# Patient Record
Sex: Female | Born: 1960 | Race: Black or African American | Hispanic: No | Marital: Married | Smoking: Never smoker
Health system: Southern US, Community
[De-identification: ages and names within clinical notes are randomized; demographics above are authoritative.]

## PROBLEM LIST (undated history)

## (undated) HISTORY — PX: TONSILLECTOMY: SUR1361

## (undated) HISTORY — PX: TUBAL LIGATION: SHX77

---

## 1998-05-21 ENCOUNTER — Encounter: Admission: RE | Admit: 1998-05-21 | Discharge: 1998-08-19 | Payer: Self-pay | Admitting: Family Medicine

## 1999-03-20 ENCOUNTER — Other Ambulatory Visit: Admission: RE | Admit: 1999-03-20 | Discharge: 1999-03-20 | Payer: Self-pay | Admitting: Obstetrics and Gynecology

## 2002-10-21 ENCOUNTER — Ambulatory Visit (HOSPITAL_COMMUNITY): Admission: RE | Admit: 2002-10-21 | Discharge: 2002-10-21 | Payer: Self-pay | Admitting: Obstetrics

## 2002-10-21 ENCOUNTER — Encounter: Payer: Self-pay | Admitting: Obstetrics

## 2004-05-10 ENCOUNTER — Ambulatory Visit (HOSPITAL_COMMUNITY): Admission: RE | Admit: 2004-05-10 | Discharge: 2004-05-10 | Payer: Self-pay | Admitting: Obstetrics

## 2005-12-02 ENCOUNTER — Ambulatory Visit (HOSPITAL_COMMUNITY): Admission: RE | Admit: 2005-12-02 | Discharge: 2005-12-02 | Payer: Self-pay | Admitting: Obstetrics

## 2005-12-10 ENCOUNTER — Ambulatory Visit (HOSPITAL_COMMUNITY): Admission: RE | Admit: 2005-12-10 | Discharge: 2005-12-10 | Payer: Self-pay | Admitting: Obstetrics

## 2006-11-19 ENCOUNTER — Encounter: Admission: RE | Admit: 2006-11-19 | Discharge: 2006-11-19 | Payer: Self-pay | Admitting: Family Medicine

## 2010-02-23 ENCOUNTER — Encounter: Payer: Self-pay | Admitting: Obstetrics

## 2010-10-17 ENCOUNTER — Other Ambulatory Visit (HOSPITAL_COMMUNITY): Payer: Self-pay | Admitting: Obstetrics and Gynecology

## 2010-10-17 DIAGNOSIS — Z1231 Encounter for screening mammogram for malignant neoplasm of breast: Secondary | ICD-10-CM

## 2010-10-29 ENCOUNTER — Ambulatory Visit (HOSPITAL_COMMUNITY)
Admission: RE | Admit: 2010-10-29 | Discharge: 2010-10-29 | Disposition: A | Discharge status: Home / Self Care | Payer: BC Managed Care – PPO | Source: Ambulatory Visit | Attending: Obstetrics and Gynecology | Admitting: Obstetrics and Gynecology

## 2010-10-29 DIAGNOSIS — Z1231 Encounter for screening mammogram for malignant neoplasm of breast: Secondary | ICD-10-CM | POA: Insufficient documentation

## 2012-05-15 ENCOUNTER — Emergency Department (HOSPITAL_COMMUNITY)
Admission: EM | Admit: 2012-05-15 | Discharge: 2012-05-15 | Disposition: A | Discharge status: Home / Self Care | Payer: BC Managed Care – PPO | Attending: Emergency Medicine | Admitting: Emergency Medicine

## 2012-05-15 ENCOUNTER — Encounter (HOSPITAL_COMMUNITY): Payer: Self-pay | Admitting: Emergency Medicine

## 2012-05-15 ENCOUNTER — Emergency Department (HOSPITAL_COMMUNITY): Payer: BC Managed Care – PPO

## 2012-05-15 DIAGNOSIS — R079 Chest pain, unspecified: Secondary | ICD-10-CM

## 2012-05-15 DIAGNOSIS — R0602 Shortness of breath: Secondary | ICD-10-CM | POA: Insufficient documentation

## 2012-05-15 DIAGNOSIS — R0789 Other chest pain: Secondary | ICD-10-CM | POA: Insufficient documentation

## 2012-05-15 LAB — URINALYSIS, ROUTINE W REFLEX MICROSCOPIC
Bilirubin Urine: NEGATIVE
Ketones, ur: NEGATIVE mg/dL
Nitrite: NEGATIVE
Protein, ur: NEGATIVE mg/dL
Urobilinogen, UA: 0.2 mg/dL (ref 0.0–1.0)

## 2012-05-15 LAB — CBC WITH DIFFERENTIAL/PLATELET
Basophils Absolute: 0 10*3/uL (ref 0.0–0.1)
Basophils Relative: 0 % (ref 0–1)
Eosinophils Absolute: 0.1 10*3/uL (ref 0.0–0.7)
Hemoglobin: 13.3 g/dL (ref 12.0–15.0)
MCH: 28.5 pg (ref 26.0–34.0)
MCHC: 34.6 g/dL (ref 30.0–36.0)
Monocytes Absolute: 0.5 10*3/uL (ref 0.1–1.0)
Monocytes Relative: 6 % (ref 3–12)
Neutrophils Relative %: 62 % (ref 43–77)
RDW: 14.2 % (ref 11.5–15.5)

## 2012-05-15 LAB — POCT I-STAT, CHEM 8
Calcium, Ion: 0.98 mmol/L — ABNORMAL LOW (ref 1.12–1.23)
Glucose, Bld: 114 mg/dL — ABNORMAL HIGH (ref 70–99)
HCT: 41 % (ref 36.0–46.0)
Hemoglobin: 13.9 g/dL (ref 12.0–15.0)
TCO2: 21 mmol/L (ref 0–100)

## 2012-05-15 LAB — BASIC METABOLIC PANEL
BUN: 9 mg/dL (ref 6–23)
Creatinine, Ser: 0.76 mg/dL (ref 0.50–1.10)
GFR calc Af Amer: >90 mL/min (ref >90)
GFR calc non Af Amer: >90 mL/min (ref >90)

## 2012-05-15 NOTE — ED Provider Notes (Signed)
Patient's symptoms are atypical for heart disease. It has been constant for the last 2 days. Her cardiac markers are normal.  Low suspicion for heart disease  Chloe Kras, MD 05/15/12 [phone]

## 2012-05-15 NOTE — ED Provider Notes (Signed)
History     CSN: [id number]  Arrival date & time 05/15/12  1346   First MD Initiated Contact with Patient 05/15/12 1352      Chief Complaint  Patient presents with  . Chest Pain    (Consider location/radiation/quality/duration/timing/severity/associated sxs/prior treatment) HPI Comments: Patient is a 52 year old female who presents today after experiencing 2 days of gradually worsening chest pain. She describes it as a pressure with radiation into her right arm. It is worse with movement and palpation. The pain is centrally located on her sternum with radiation into her right ribs. She believed this was heartburn and took antacids which did not help. Her pain is improved when she belches and passes gas. She has sometimes been short of breath. Position change help such as sitting upright. Her mother had a heart attack at a young age. She is not sure how old she was. No history of heart disease. She was evaluated by her family physician and was sent here.  The history is provided by the patient. No language interpreter was used.    History reviewed. No pertinent past medical history.  Past Surgical History  Procedure Laterality Date  . Tonsillectomy    . Cesarean section      x3  . Tubal ligation      No family history on file.  History  Substance Use Topics  . Smoking status: Not on file  . Smokeless tobacco: Not on file  . Alcohol Use: Not on file    OB History   Grav Para Term Preterm Abortions TAB SAB Ect Mult Living                  Review of Systems  Constitutional: Negative for fever and chills.  Respiratory: Positive for chest tightness and shortness of breath.   Cardiovascular: Positive for chest pain. Negative for palpitations.  Neurological: Negative for syncope, weakness and numbness.  All other systems reviewed and are negative.    Allergies  Penicillins  Home Medications  No current outpatient prescriptions on file.  LMP 04/25/2012  Physical  Exam  Nursing note and vitals reviewed. Constitutional: She is oriented to person, place, and time. She appears well-developed and well-nourished. No distress.  HENT:  Head: Normocephalic and atraumatic.  Right Ear: External ear normal.  Left Ear: External ear normal.  Nose: Nose normal.  Mouth/Throat: Oropharynx is clear and moist.  Eyes: Conjunctivae are normal.  Neck: Normal range of motion.  Cardiovascular: Normal rate, regular rhythm and normal heart sounds.   Pulmonary/Chest: Effort normal and breath sounds normal. No stridor. No respiratory distress. She has no wheezes. She has no rales. She exhibits tenderness and bony tenderness. She exhibits no deformity and no swelling.  Abdominal: Soft. She exhibits no distension.  Musculoskeletal: Normal range of motion.  Neurological: She is alert and oriented to person, place, and time. She has normal strength.  Skin: Skin is warm and dry. She is not diaphoretic. No erythema.  Psychiatric: She has a normal mood and affect. Her behavior is normal.    ED Course  Procedures (including critical care time)  Labs Reviewed  BASIC METABOLIC PANEL - Abnormal; Notable for the following:    Glucose, Bld 107 (*)    All other components within normal limits  POCT I-STAT, CHEM 8 - Abnormal; Notable for the following:    Glucose, Bld 114 (*)    Calcium, Ion 0.98 (*)    All other components within normal limits  CBC WITH DIFFERENTIAL  URINALYSIS, ROUTINE W REFLEX MICROSCOPIC  POCT I-STAT TROPONIN I   Dg Chest 2 View  05/15/2012  *RADIOLOGY REPORT*  Clinical Data: Right-sided chest pain.  CHEST - 2 VIEW  Comparison: None.  Findings: Two views of the chest demonstrate clear lungs. Heart and mediastinum are within normal limits.  The trachea is midline. Bony thorax is intact.  IMPRESSION: No active cardiopulmonary disease.   Original Report Authenticated By: Richarda Overlie, M.D.      Date: 05/15/2012  Rate: 97  Rhythm: normal sinus rhythm  QRS Axis:  normal  Intervals: normal  ST/T Wave abnormalities: nonspecific T wave changes  Conduction Disutrbances:none  Narrative Interpretation:   Old EKG Reviewed: none available   1. Chest pain       MDM  Patient presents with 2 days of constant chest pain. Troponin 0. Labs grossly within normal limits. No concern for ACS. Should have outpatient work up with significant family history of cardiac disease. Discussed this is likely heartburn. You can take antacids. Follow up with PCP by next week. Strict return precautions given. Vital signs stable for discharge. Dr. Lynelle Doctor saw this patient and agrees with plan. Patient / Family / Caregiver informed of clinical course, understand medical decision-making process, and agree with plan.         Mora Bellman, PA-C 05/15/12 [phone]

## 2012-05-15 NOTE — ED Notes (Signed)
Pt c/o CP x 2 days. Mother of pt had massive MI at age 51. Pt thought her cp was gas and tx with antacids without relief. Relief when she expells gas, movement exacerbates pain. Went to md office this am and was sent here.

## 2013-03-18 ENCOUNTER — Other Ambulatory Visit: Payer: Self-pay | Admitting: Internal Medicine

## 2013-03-18 DIAGNOSIS — Z1231 Encounter for screening mammogram for malignant neoplasm of breast: Secondary | ICD-10-CM

## 2013-04-08 ENCOUNTER — Ambulatory Visit (HOSPITAL_COMMUNITY)
Admission: RE | Admit: 2013-04-08 | Discharge: 2013-04-08 | Disposition: A | Discharge status: Home / Self Care | Payer: BC Managed Care – PPO | Source: Ambulatory Visit | Attending: Internal Medicine | Admitting: Internal Medicine

## 2013-04-08 DIAGNOSIS — Z1231 Encounter for screening mammogram for malignant neoplasm of breast: Secondary | ICD-10-CM | POA: Insufficient documentation

## 2013-04-18 ENCOUNTER — Other Ambulatory Visit: Payer: Self-pay | Admitting: Internal Medicine

## 2013-04-18 DIAGNOSIS — R928 Other abnormal and inconclusive findings on diagnostic imaging of breast: Secondary | ICD-10-CM

## 2013-05-25 ENCOUNTER — Ambulatory Visit
Admission: RE | Admit: 2013-05-25 | Discharge: 2013-05-25 | Disposition: A | Discharge status: Home / Self Care | Payer: BC Managed Care – PPO | Source: Ambulatory Visit | Attending: Internal Medicine | Admitting: Internal Medicine

## 2013-05-25 DIAGNOSIS — R928 Other abnormal and inconclusive findings on diagnostic imaging of breast: Secondary | ICD-10-CM

## 2014-01-20 ENCOUNTER — Other Ambulatory Visit: Payer: Self-pay | Admitting: Internal Medicine

## 2014-01-20 DIAGNOSIS — R921 Mammographic calcification found on diagnostic imaging of breast: Secondary | ICD-10-CM

## 2014-02-06 ENCOUNTER — Ambulatory Visit
Admission: RE | Admit: 2014-02-06 | Discharge: 2014-02-06 | Disposition: A | Discharge status: Home / Self Care | Payer: BC Managed Care – PPO | Source: Ambulatory Visit | Attending: Internal Medicine | Admitting: Internal Medicine

## 2014-02-06 DIAGNOSIS — R921 Mammographic calcification found on diagnostic imaging of breast: Secondary | ICD-10-CM

## 2014-04-17 ENCOUNTER — Encounter (HOSPITAL_COMMUNITY): Payer: Self-pay | Admitting: Emergency Medicine

## 2014-04-17 ENCOUNTER — Emergency Department (HOSPITAL_COMMUNITY)
Admission: EM | Admit: 2014-04-17 | Discharge: 2014-04-18 | Disposition: A | Discharge status: Home / Self Care | Payer: BC Managed Care – PPO | Attending: Emergency Medicine | Admitting: Emergency Medicine

## 2014-04-17 DIAGNOSIS — Z7982 Long term (current) use of aspirin: Secondary | ICD-10-CM | POA: Insufficient documentation

## 2014-04-17 DIAGNOSIS — Z79899 Other long term (current) drug therapy: Secondary | ICD-10-CM | POA: Insufficient documentation

## 2014-04-17 DIAGNOSIS — R200 Anesthesia of skin: Secondary | ICD-10-CM

## 2014-04-17 DIAGNOSIS — E119 Type 2 diabetes mellitus without complications: Secondary | ICD-10-CM | POA: Diagnosis not present

## 2014-04-17 DIAGNOSIS — Z8669 Personal history of other diseases of the nervous system and sense organs: Secondary | ICD-10-CM | POA: Diagnosis not present

## 2014-04-17 DIAGNOSIS — M79609 Pain in unspecified limb: Secondary | ICD-10-CM

## 2014-04-17 DIAGNOSIS — Z880 Allergy status to penicillin: Secondary | ICD-10-CM | POA: Diagnosis not present

## 2014-04-17 DIAGNOSIS — R202 Paresthesia of skin: Secondary | ICD-10-CM | POA: Insufficient documentation

## 2014-04-17 DIAGNOSIS — E669 Obesity, unspecified: Secondary | ICD-10-CM | POA: Insufficient documentation

## 2014-04-17 NOTE — ED Notes (Signed)
Pt c/o tingling, numbness, and heaviness in her L hand since yesterday evening. Pt sts she had an episode of Bell's Palsy 15 years ago - pt has L side facial dropping as a result. Pt and family st that the drooping in her face is per normal. Pt Able to move L hand and can feel RN touching her. Pt can pick objects up with L hand.  A&Ox4 and ambulatory.

## 2014-04-18 ENCOUNTER — Emergency Department (HOSPITAL_COMMUNITY): Payer: BC Managed Care – PPO

## 2014-04-18 LAB — CBC WITH DIFFERENTIAL/PLATELET
BASOS ABS: 0 10*3/uL (ref 0.0–0.1)
Basophils Relative: 0 % (ref 0–1)
EOS PCT: 2 % (ref 0–5)
Eosinophils Absolute: 0.1 10*3/uL (ref 0.0–0.7)
HCT: 38.9 % (ref 36.0–46.0)
Hemoglobin: 12.8 g/dL (ref 12.0–15.0)
LYMPHS ABS: 3.1 10*3/uL (ref 0.7–4.0)
LYMPHS PCT: 42 % (ref 12–46)
MCH: 27.9 pg (ref 26.0–34.0)
MCHC: 32.9 g/dL (ref 30.0–36.0)
MCV: 84.7 fL (ref 78.0–100.0)
MONO ABS: 0.5 10*3/uL (ref 0.1–1.0)
MONOS PCT: 7 % (ref 3–12)
NEUTROS ABS: 3.6 10*3/uL (ref 1.7–7.7)
NEUTROS PCT: 49 % (ref 43–77)
PLATELETS: 280 10*3/uL (ref 150–400)
RBC: 4.59 MIL/uL (ref 3.87–5.11)
RDW: 14.8 % (ref 11.5–15.5)
WBC: 7.4 10*3/uL (ref 4.0–10.5)

## 2014-04-18 LAB — I-STAT CHEM 8, ED
BUN: 9 mg/dL (ref 6–23)
CHLORIDE: 102 mmol/L (ref 96–112)
CREATININE: 0.7 mg/dL (ref 0.50–1.10)
Calcium, Ion: 1.06 mmol/L — ABNORMAL LOW (ref 1.12–1.23)
GLUCOSE: 134 mg/dL — AB (ref 70–99)
HEMATOCRIT: 43 % (ref 36.0–46.0)
Hemoglobin: 14.6 g/dL (ref 12.0–15.0)
POTASSIUM: 4.1 mmol/L (ref 3.5–5.1)
Sodium: 137 mmol/L (ref 135–145)
TCO2: 21 mmol/L (ref 0–100)

## 2014-04-18 NOTE — Discharge Instructions (Signed)
Today, no specific cause for your paresthesia has been identified.  Your head CT is normal, your lab work is normal.  Your blood sugar is not out of control.  Please make an appointment with your primary care physician for further evaluation.  Please keep a written diary of your symptoms.  What makes it better or worse.  How long it lasts its progression, etc.

## 2014-04-18 NOTE — ED Provider Notes (Signed)
CSN: [id number]     Arrival date & time 04/17/14  1923 History   First MD Initiated Contact with Patient 04/18/14 0134     Chief Complaint  Patient presents with  . Numbness  . Tingling     (Consider location/radiation/quality/duration/timing/severity/associated sxs/prior Treatment) HPI Comments: Patient reports that she's had heaviness in tingling, i.e., pins and needles in her left hand since yesterday.  Since arriving in the emergency department today night.  The sensation has gone up to mid upper arm.  She does not have any weakness or decreased ability to grasp objects.  She has not lost sensation.  She reports that her blood sugar this morning was 167, which is slightly high for her.  She normally is in the 120 range.  She's been a non-insulin-dependent diabetic for the past 6 months.  Denies any recent illnesses or trauma .  Denies any neck pain.  She states she had a Bell's palsy which left her with a left facial droop proximally 15 years ago.  She states her ophthalmologist told her it was a stroke because she had speech difficulties at that time but did not have a CT scan or neurologic evaluation at that time.  The history is provided by the patient.    History reviewed. No pertinent past medical history. Past Surgical History  Procedure Laterality Date  . Tonsillectomy    . Cesarean section      x3  . Tubal ligation     No family history on file. History  Substance Use Topics  . Smoking status: Never Smoker   . Smokeless tobacco: Not on file  . Alcohol Use: No   OB History    No data available     Review of Systems  Constitutional: Negative for fever and chills.  Respiratory: Negative for shortness of breath.   Cardiovascular: Negative for chest pain and leg swelling.  Endocrine: Negative for polydipsia, polyphagia and polyuria.  Genitourinary: Negative for dysuria.  Musculoskeletal: Negative for neck pain and neck stiffness.  Skin: Negative for rash and wound.   Neurological: Positive for numbness. Negative for dizziness, speech difficulty, weakness and headaches.      Allergies  Azithromycin and Penicillins  Home Medications   Prior to Admission medications   Medication Sig Start Date End Date Taking? Authorizing Provider  aspirin EC 81 MG tablet Take 81 mg by mouth daily.   Yes Historical Provider, MD  esomeprazole (NEXIUM) 20 MG capsule Take 20 mg by mouth daily at 12 noon.   Yes Historical Provider, MD  metFORMIN (GLUCOPHAGE) 1000 MG tablet Take 1,000 mg by mouth daily with breakfast.   Yes Historical Provider, MD  Multiple Vitamin (MULTIVITAMIN WITH MINERALS) TABS Take 1 tablet by mouth daily. Chewable vitamin   Yes Historical Provider, MD  VITAMIN D, CHOLECALCIFEROL, PO Take 1 tablet by mouth daily.   Yes Historical Provider, MD  furosemide (LASIX) 20 MG tablet Take 20 mg by mouth daily as needed (swelling).    Historical Provider, MD  pantoprazole (PROTONIX) 40 MG tablet Take 40 mg by mouth daily as needed (for acid).    Historical Provider, MD   BP 126/65 mmHg  Pulse 75  Temp(Src) 98 F (36.7 C) (Oral)  Resp 16  SpO2 100%  LMP 01/28/2014 Physical Exam  Constitutional: She appears well-developed.  Obese  HENT:  Head: Normocephalic.  Eyes: Pupils are equal, round, and reactive to light.  Neck: Normal range of motion.  Cardiovascular: Normal rate and regular rhythm.  Pulmonary/Chest: Breath sounds normal.  Abdominal: Soft. She exhibits no distension. There is no tenderness.  Musculoskeletal: She exhibits no edema.       Left elbow: She exhibits normal range of motion, no swelling and no deformity.       Left wrist: She exhibits normal range of motion, no tenderness, no bony tenderness, no swelling and no deformity.       Left upper arm: She exhibits no tenderness, no bony tenderness and no swelling.  A she has full range of motion.  She has strong and equal grips.  She has normal sensation.  There is no discoloration change  in temperature, swelling, rash or discoloration noted on the left  upper extremity  Skin: Skin is warm and dry. No rash noted. No erythema.  Vitals reviewed.   ED Course  Procedures (including critical care time) Labs Review Labs Reviewed  I-STAT CHEM 8, ED - Abnormal; Notable for the following:    Glucose, Bld 134 (*)    Calcium, Ion 1.06 (*)    All other components within normal limits  CBC WITH DIFFERENTIAL/PLATELET    Imaging Review Ct Head Wo Contrast  04/18/2014   CLINICAL DATA:  Left arm paresthesias  EXAM: CT HEAD WITHOUT CONTRAST  TECHNIQUE: Contiguous axial images were obtained from the base of the skull through the vertex without intravenous contrast.  COMPARISON:  None.  FINDINGS: There is no intracranial hemorrhage, mass or evidence of acute infarction. Gray matter and white matter are normal. The ventricles and basal cisterns appear unremarkable.  The bony structures are intact. The visible portions of the paranasal sinuses are clear.  IMPRESSION: Normal brain   Electronically Signed   By: Ellery Plunk M.D.   On: 04/18/2014 04:26     EKG Interpretation None     Patient's labs and head CT evaluated all within normal parameters.  Her blood glucose is slightly elevated at 134, but this should not be causing her symptoms.  The paresthesia is unexplained at this point.  Patient has been discharged home with follow-up with her primary care physician.  She is to keep a written diary of her symptoms and its progress MDM   Final diagnoses:  Numbness and tingling in hands  Paresthesia and pain of left extremity         Earley Favor, NP 04/18/14 0148  Earley Favor, NP 04/18/14 0502  Tomasita Crumble, MD 04/18/14 [phone]

## 2014-07-24 ENCOUNTER — Other Ambulatory Visit: Payer: Self-pay | Admitting: Internal Medicine

## 2014-07-24 DIAGNOSIS — R921 Mammographic calcification found on diagnostic imaging of breast: Secondary | ICD-10-CM

## 2014-08-16 ENCOUNTER — Ambulatory Visit
Admission: RE | Admit: 2014-08-16 | Discharge: 2014-08-16 | Disposition: A | Discharge status: Home / Self Care | Payer: BC Managed Care – PPO | Source: Ambulatory Visit | Attending: Internal Medicine | Admitting: Internal Medicine

## 2014-08-16 DIAGNOSIS — R921 Mammographic calcification found on diagnostic imaging of breast: Secondary | ICD-10-CM

## 2015-06-01 ENCOUNTER — Other Ambulatory Visit: Payer: Self-pay | Admitting: Family

## 2015-06-01 DIAGNOSIS — Z1231 Encounter for screening mammogram for malignant neoplasm of breast: Secondary | ICD-10-CM

## 2015-06-12 ENCOUNTER — Telehealth: Payer: Self-pay | Admitting: Dietician

## 2015-06-12 NOTE — Telephone Encounter (Signed)
Opened in error

## 2015-08-21 ENCOUNTER — Ambulatory Visit: Payer: BC Managed Care – PPO

## 2015-08-31 ENCOUNTER — Ambulatory Visit
Admission: RE | Admit: 2015-08-31 | Discharge: 2015-08-31 | Disposition: A | Discharge status: Home / Self Care | Payer: BC Managed Care – PPO | Source: Ambulatory Visit | Attending: Family | Admitting: Family

## 2015-08-31 DIAGNOSIS — Z1231 Encounter for screening mammogram for malignant neoplasm of breast: Secondary | ICD-10-CM

## 2015-09-04 ENCOUNTER — Other Ambulatory Visit: Payer: Self-pay | Admitting: Family

## 2015-09-04 DIAGNOSIS — R928 Other abnormal and inconclusive findings on diagnostic imaging of breast: Secondary | ICD-10-CM

## 2015-09-14 ENCOUNTER — Other Ambulatory Visit: Payer: BC Managed Care – PPO

## 2015-09-19 ENCOUNTER — Ambulatory Visit
Admission: RE | Admit: 2015-09-19 | Discharge: 2015-09-19 | Disposition: A | Discharge status: Home / Self Care | Payer: BC Managed Care – PPO | Source: Ambulatory Visit | Attending: Family | Admitting: Family

## 2015-09-19 DIAGNOSIS — R928 Other abnormal and inconclusive findings on diagnostic imaging of breast: Secondary | ICD-10-CM

## 2016-10-13 ENCOUNTER — Other Ambulatory Visit: Payer: Self-pay | Admitting: Family

## 2016-10-13 DIAGNOSIS — Z1231 Encounter for screening mammogram for malignant neoplasm of breast: Secondary | ICD-10-CM

## 2016-10-14 ENCOUNTER — Ambulatory Visit: Payer: BC Managed Care – PPO

## 2017-04-13 ENCOUNTER — Other Ambulatory Visit: Payer: Self-pay | Admitting: Family Medicine

## 2017-04-13 DIAGNOSIS — Z1231 Encounter for screening mammogram for malignant neoplasm of breast: Secondary | ICD-10-CM

## 2017-04-17 ENCOUNTER — Ambulatory Visit: Payer: BC Managed Care – PPO

## 2017-05-06 ENCOUNTER — Ambulatory Visit: Payer: BC Managed Care – PPO

## 2017-07-03 ENCOUNTER — Ambulatory Visit
Admission: RE | Admit: 2017-07-03 | Discharge: 2017-07-03 | Disposition: A | Discharge status: Home / Self Care | Payer: BC Managed Care – PPO | Source: Ambulatory Visit | Attending: Family Medicine | Admitting: Family Medicine

## 2017-07-03 DIAGNOSIS — Z1231 Encounter for screening mammogram for malignant neoplasm of breast: Secondary | ICD-10-CM

## 2018-01-25 ENCOUNTER — Ambulatory Visit: Payer: BC Managed Care – PPO | Admitting: Registered"

## 2019-04-14 ENCOUNTER — Other Ambulatory Visit: Payer: Self-pay | Admitting: Family Medicine

## 2019-04-14 DIAGNOSIS — Z1231 Encounter for screening mammogram for malignant neoplasm of breast: Secondary | ICD-10-CM

## 2019-05-09 ENCOUNTER — Ambulatory Visit
Admission: RE | Admit: 2019-05-09 | Discharge: 2019-05-09 | Disposition: A | Discharge status: Home / Self Care | Payer: 59 | Source: Ambulatory Visit | Attending: Family Medicine | Admitting: Family Medicine

## 2019-05-09 ENCOUNTER — Ambulatory Visit: Payer: Self-pay

## 2019-05-09 ENCOUNTER — Ambulatory Visit: Payer: BC Managed Care – PPO

## 2019-05-09 ENCOUNTER — Other Ambulatory Visit: Payer: Self-pay

## 2019-05-09 DIAGNOSIS — Z1231 Encounter for screening mammogram for malignant neoplasm of breast: Secondary | ICD-10-CM

## 2020-09-17 ENCOUNTER — Other Ambulatory Visit: Payer: Self-pay | Admitting: Family Medicine

## 2020-09-17 DIAGNOSIS — Z1231 Encounter for screening mammogram for malignant neoplasm of breast: Secondary | ICD-10-CM

## 2020-10-09 ENCOUNTER — Ambulatory Visit: Payer: Self-pay

## 2020-10-09 ENCOUNTER — Other Ambulatory Visit: Payer: Self-pay

## 2021-12-29 IMAGING — MG DIGITAL SCREENING BILAT W/ TOMO W/ CAD
8 series · 8 of 24 positions shown · non-contrast
Comparison: Previous exam(s).

CLINICAL DATA: Screening.

EXAM:
DIGITAL SCREENING BILATERAL MAMMOGRAM WITH TOMO AND CAD

[R CC synth-2D]
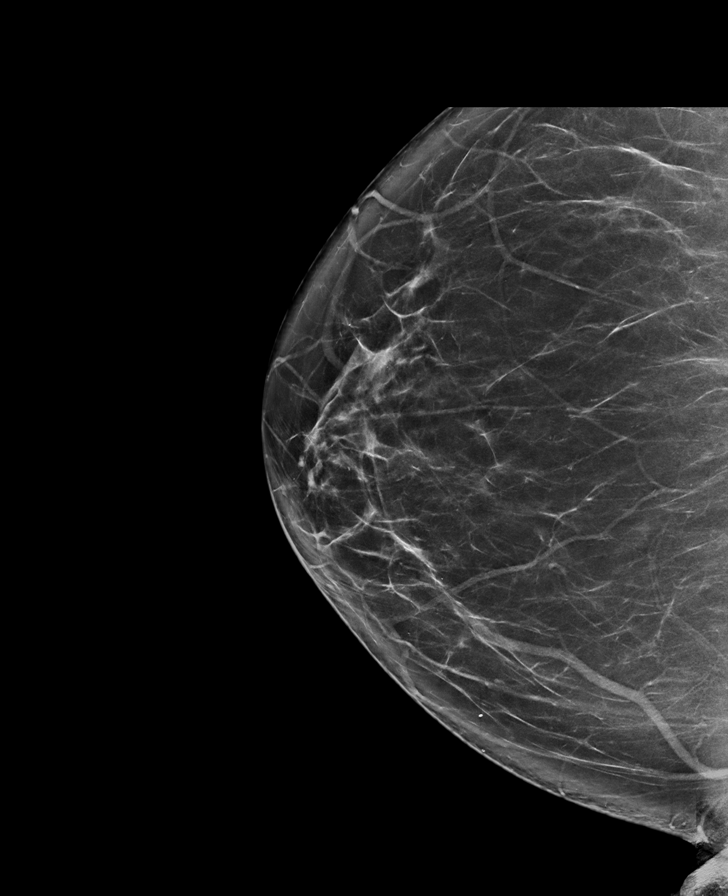

[L CC synth-2D]
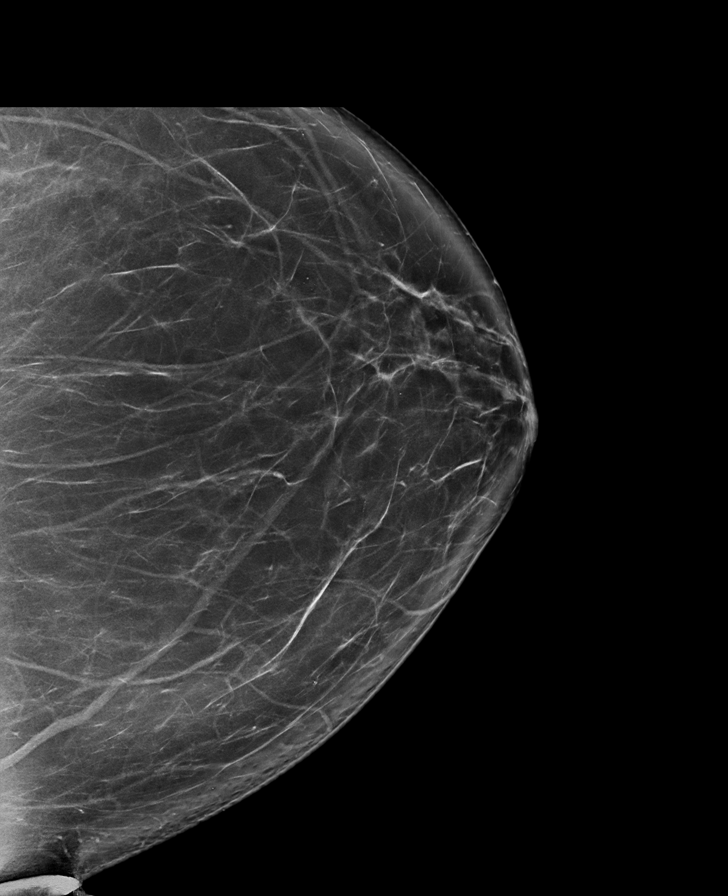

[R MLO synth-2D]
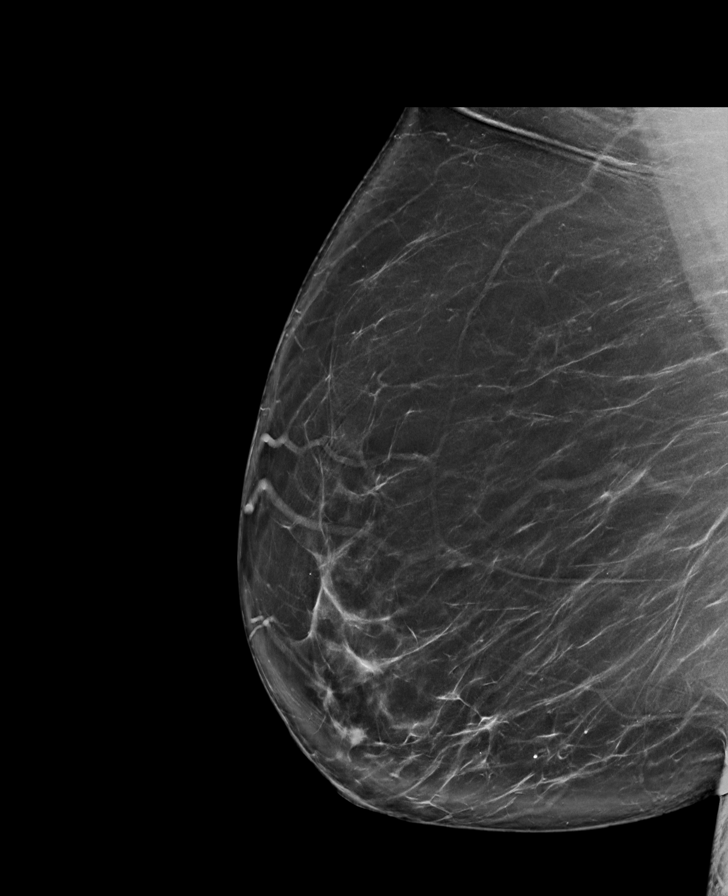

[L MLO synth-2D]
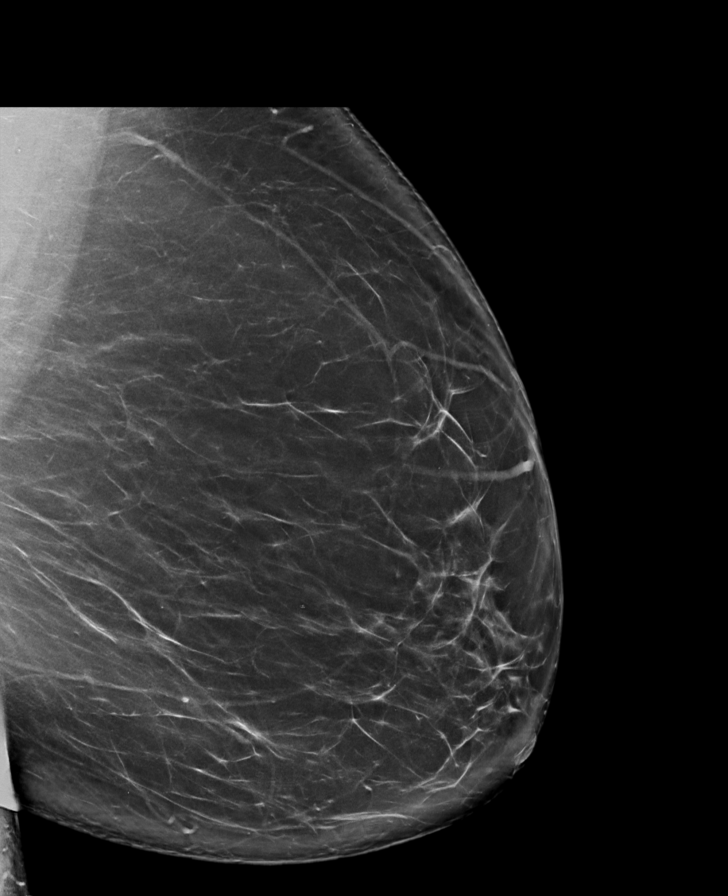

[R CC tomo · tomo slice 44/87.0]
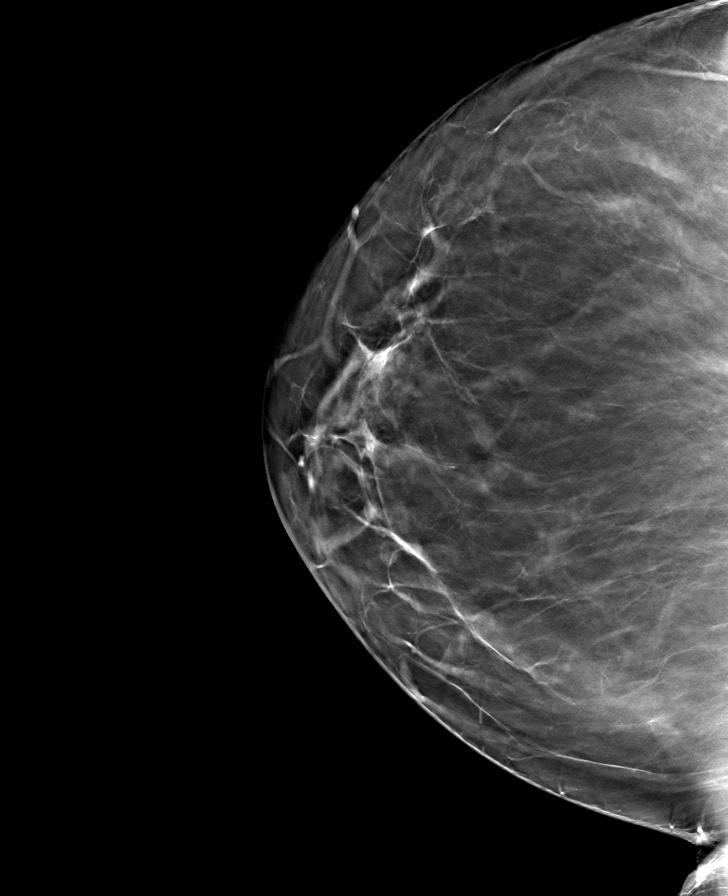

[L MLO tomo · tomo slice 52/103.0]
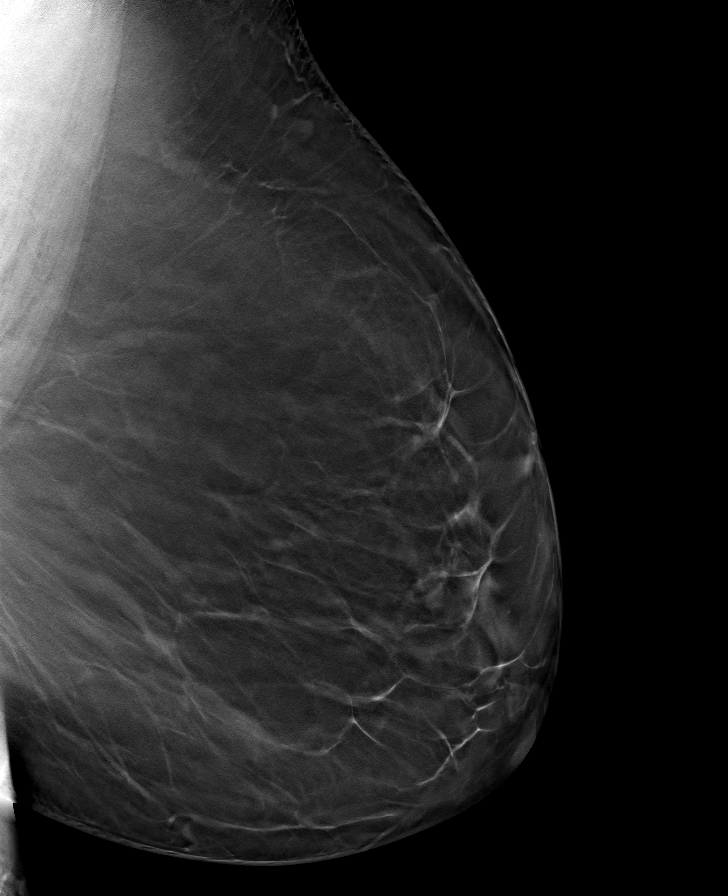

[L CC tomo · tomo slice 47/93.0]
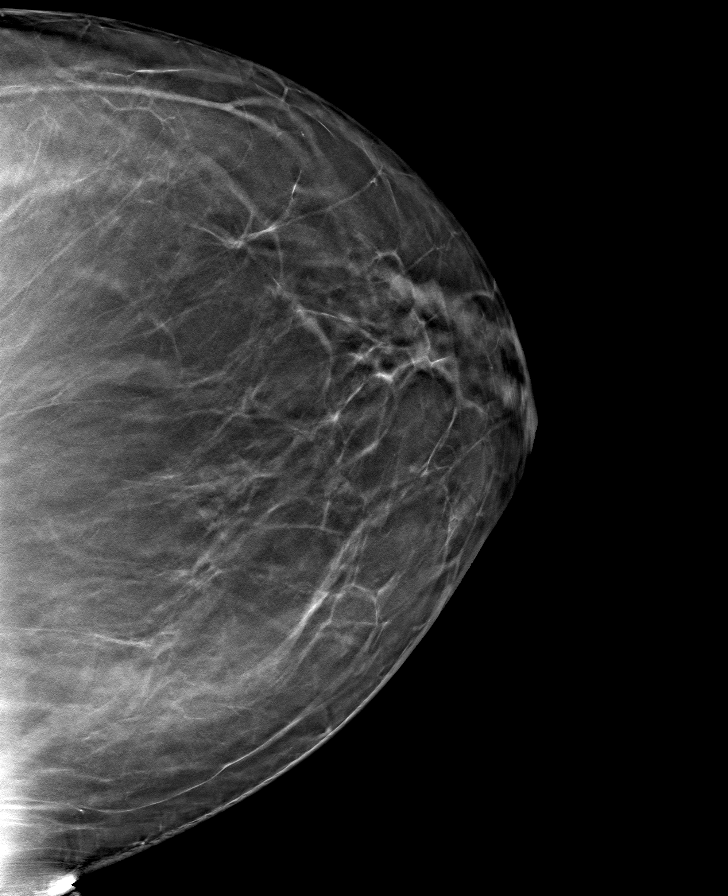

[R MLO tomo · tomo slice 51/101.0]
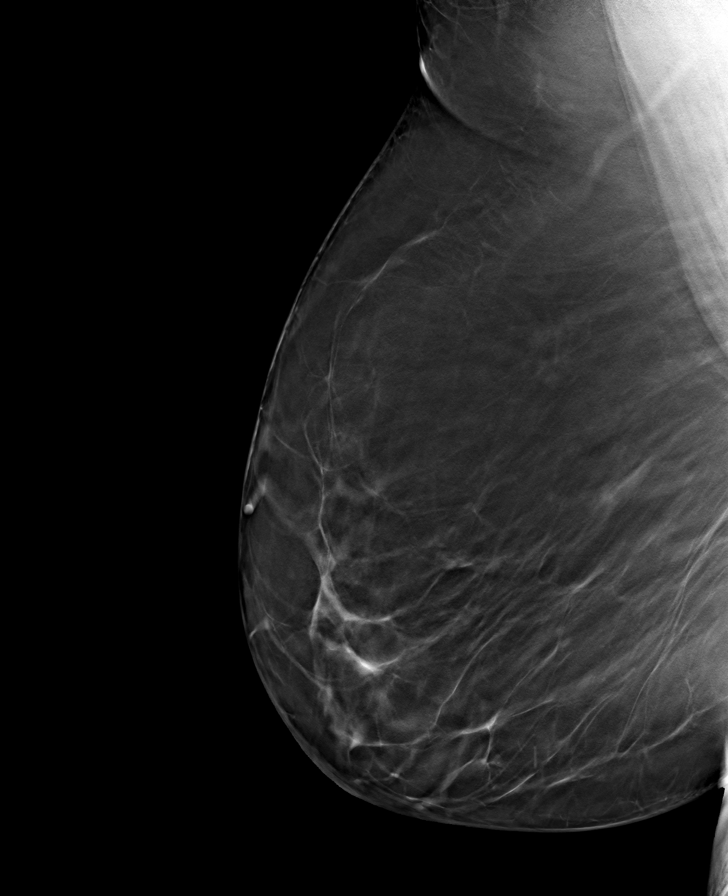

[8 of 24 positions shown; findings below may reference images not displayed]

ACR Breast Density Category b: There are scattered areas of
fibroglandular density.
FINDINGS: There are no findings suspicious for malignancy. Images were
processed with CAD.
IMPRESSION: No mammographic evidence of malignancy. A result letter of this
screening mammogram will be mailed directly to the patient.

RECOMMENDATION:
Screening mammogram in one year. (Code:[id number])

BI-RADS CATEGORY  1: Negative.
